# Patient Record
Sex: Female | Born: 1990 | Race: Asian | Hispanic: No | Marital: Single | Smoking: Never smoker
Health system: Southern US, Community
[De-identification: ages and names within clinical notes are randomized; demographics above are authoritative.]

## PROBLEM LIST (undated history)

## (undated) HISTORY — PX: OVARIAN CYST REMOVAL: SHX89

---

## 2014-04-03 ENCOUNTER — Encounter (HOSPITAL_COMMUNITY): Payer: 59 | Admitting: Anesthesiology

## 2014-04-03 ENCOUNTER — Emergency Department (HOSPITAL_COMMUNITY): Payer: 59

## 2014-04-03 ENCOUNTER — Encounter (HOSPITAL_COMMUNITY): Payer: Self-pay | Admitting: Emergency Medicine

## 2014-04-03 ENCOUNTER — Observation Stay (HOSPITAL_COMMUNITY): Payer: 59 | Admitting: Anesthesiology

## 2014-04-03 ENCOUNTER — Observation Stay (HOSPITAL_COMMUNITY)
Admission: EM | Admit: 2014-04-03 | Discharge: 2014-04-04 | Disposition: A | Discharge status: Home / Self Care | Payer: 59 | Attending: Surgery | Admitting: Surgery

## 2014-04-03 ENCOUNTER — Encounter (HOSPITAL_COMMUNITY)
Admission: EM | Disposition: A | Discharge status: Home / Self Care | Payer: Self-pay | Source: Home / Self Care | Attending: Emergency Medicine

## 2014-04-03 DIAGNOSIS — R1012 Left upper quadrant pain: Secondary | ICD-10-CM

## 2014-04-03 DIAGNOSIS — K37 Unspecified appendicitis: Secondary | ICD-10-CM

## 2014-04-03 DIAGNOSIS — K3580 Unspecified acute appendicitis: Principal | ICD-10-CM | POA: Insufficient documentation

## 2014-04-03 DIAGNOSIS — R112 Nausea with vomiting, unspecified: Secondary | ICD-10-CM

## 2014-04-03 HISTORY — PX: LAPAROSCOPIC APPENDECTOMY: SHX408

## 2014-04-03 HISTORY — PX: APPENDECTOMY: SHX54

## 2014-04-03 LAB — COMPREHENSIVE METABOLIC PANEL
ALT: 8 U/L (ref 0–35)
AST: 15 U/L (ref 0–37)
Albumin: 4.7 g/dL (ref 3.5–5.2)
Alkaline Phosphatase: 32 U/L — ABNORMAL LOW (ref 39–117)
BUN: 14 mg/dL (ref 6–23)
CO2: 21 mEq/L (ref 19–32)
Calcium: 9.4 mg/dL (ref 8.4–10.5)
Chloride: 102 mEq/L (ref 96–112)
Creatinine, Ser: 0.7 mg/dL (ref 0.50–1.10)
GFR calc Af Amer: >90 mL/min (ref >90)
GFR calc non Af Amer: >90 mL/min (ref >90)
Glucose, Bld: 139 mg/dL — ABNORMAL HIGH (ref 70–99)
Potassium: 3.5 mEq/L — ABNORMAL LOW (ref 3.7–5.3)
Sodium: 138 mEq/L (ref 137–147)
TOTAL PROTEIN: 7.8 g/dL (ref 6.0–8.3)
Total Bilirubin: 0.7 mg/dL (ref 0.3–1.2)

## 2014-04-03 LAB — CBC WITH DIFFERENTIAL/PLATELET
BASOS ABS: 0 10*3/uL (ref 0.0–0.1)
Basophils Relative: 0 % (ref 0–1)
EOS ABS: 0 10*3/uL (ref 0.0–0.7)
Eosinophils Relative: 0 % (ref 0–5)
HCT: 36.9 % (ref 36.0–46.0)
HEMOGLOBIN: 12.7 g/dL (ref 12.0–15.0)
Lymphocytes Relative: 18 % (ref 12–46)
Lymphs Abs: 2.6 10*3/uL (ref 0.7–4.0)
MCH: 31.1 pg (ref 26.0–34.0)
MCHC: 34.4 g/dL (ref 30.0–36.0)
MCV: 90.4 fL (ref 78.0–100.0)
MONOS PCT: 7 % (ref 3–12)
Monocytes Absolute: 1 10*3/uL (ref 0.1–1.0)
NEUTROS ABS: 10.6 10*3/uL — AB (ref 1.7–7.7)
Neutrophils Relative %: 75 % (ref 43–77)
PLATELETS: 190 10*3/uL (ref 150–400)
RBC: 4.08 MIL/uL (ref 3.87–5.11)
RDW: 11.7 % (ref 11.5–15.5)
WBC: 14.3 10*3/uL — ABNORMAL HIGH (ref 4.0–10.5)

## 2014-04-03 LAB — URINALYSIS, ROUTINE W REFLEX MICROSCOPIC
Bilirubin Urine: NEGATIVE
GLUCOSE, UA: NEGATIVE mg/dL
Hgb urine dipstick: NEGATIVE
Ketones, ur: 40 mg/dL — AB
NITRITE: NEGATIVE
PH: 5.5 (ref 5.0–8.0)
Protein, ur: NEGATIVE mg/dL
SPECIFIC GRAVITY, URINE: 1.023 (ref 1.005–1.030)
Urobilinogen, UA: 0.2 mg/dL (ref 0.0–1.0)

## 2014-04-03 LAB — LIPASE, BLOOD: LIPASE: 42 U/L (ref 11–59)

## 2014-04-03 LAB — POC URINE PREG, ED: Preg Test, Ur: NEGATIVE

## 2014-04-03 LAB — URINE MICROSCOPIC-ADD ON

## 2014-04-03 SURGERY — APPENDECTOMY, LAPAROSCOPIC
Anesthesia: General | Site: Abdomen

## 2014-04-03 MED ORDER — PROPOFOL 10 MG/ML IV BOLUS
INTRAVENOUS | Status: DC | PRN
  Administered 2014-04-03: 150 mg via INTRAVENOUS

## 2014-04-03 MED ORDER — MORPHINE SULFATE 2 MG/ML IJ SOLN: 2.0000 mg | INTRAMUSCULAR | Status: DC | PRN

## 2014-04-03 MED ORDER — LACTATED RINGERS IV SOLN
INTRAVENOUS | Status: DC | PRN
  Administered 2014-04-03: 09:00:00 via INTRAVENOUS

## 2014-04-03 MED ORDER — PIPERACILLIN-TAZOBACTAM 3.375 G IVPB
3.3750 g | Freq: Three times a day (TID) | INTRAVENOUS | Status: DC
  Filled 2014-04-03 (×2): qty 50

## 2014-04-03 MED ORDER — CEFAZOLIN SODIUM-DEXTROSE 2-3 GM-% IV SOLR
2.0000 g | Freq: Once | INTRAVENOUS | Status: AC
  Administered 2014-04-03: 2 g via INTRAVENOUS

## 2014-04-03 MED ORDER — CEFAZOLIN SODIUM-DEXTROSE 2-3 GM-% IV SOLR
INTRAVENOUS | Status: AC
  Filled 2014-04-03: qty 50

## 2014-04-03 MED ORDER — ONDANSETRON HCL 4 MG/2ML IJ SOLN
4.0000 mg | Freq: Once | INTRAMUSCULAR | Status: AC
  Administered 2014-04-03: 4 mg via INTRAVENOUS
  Filled 2014-04-03: qty 2

## 2014-04-03 MED ORDER — SODIUM CHLORIDE 0.9 % IV BOLUS (SEPSIS)
1000.0000 mL | Freq: Once | INTRAVENOUS | Status: AC
  Administered 2014-04-03: 1000 mL via INTRAVENOUS

## 2014-04-03 MED ORDER — LIDOCAINE HCL (CARDIAC) 20 MG/ML IV SOLN
INTRAVENOUS | Status: DC | PRN
  Administered 2014-04-03: 60 mg via INTRAVENOUS

## 2014-04-03 MED ORDER — IOHEXOL 300 MG/ML  SOLN
20.0000 mL | INTRAMUSCULAR | Status: AC
  Administered 2014-04-03: 25 mL via ORAL

## 2014-04-03 MED ORDER — OXYCODONE HCL 5 MG/5ML PO SOLN: 5.0000 mg | Freq: Once | ORAL | Status: DC | PRN

## 2014-04-03 MED ORDER — MIDAZOLAM HCL 2 MG/2ML IJ SOLN
INTRAMUSCULAR | Status: AC
  Filled 2014-04-03: qty 2

## 2014-04-03 MED ORDER — OXYCODONE-ACETAMINOPHEN 5-325 MG PO TABS: 1.0000 | ORAL_TABLET | ORAL | Status: DC | PRN

## 2014-04-03 MED ORDER — ONDANSETRON HCL 4 MG/2ML IJ SOLN: 4.0000 mg | Freq: Once | INTRAMUSCULAR | Status: DC | PRN

## 2014-04-03 MED ORDER — FENTANYL CITRATE 0.05 MG/ML IJ SOLN
50.0000 ug | Freq: Once | INTRAMUSCULAR | Status: AC
  Administered 2014-04-03: 50 ug via INTRAVENOUS
  Filled 2014-04-03: qty 2

## 2014-04-03 MED ORDER — OXYCODONE HCL 5 MG PO TABS: 5.0000 mg | ORAL_TABLET | Freq: Once | ORAL | Status: DC | PRN

## 2014-04-03 MED ORDER — HYDROMORPHONE HCL PF 1 MG/ML IJ SOLN: 0.2500 mg | INTRAMUSCULAR | Status: DC | PRN

## 2014-04-03 MED ORDER — ONDANSETRON HCL 4 MG/2ML IJ SOLN
INTRAMUSCULAR | Status: DC | PRN
  Administered 2014-04-03: 4 mg via INTRAVENOUS

## 2014-04-03 MED ORDER — MORPHINE SULFATE 2 MG/ML IJ SOLN: 1.0000 mg | INTRAMUSCULAR | Status: DC | PRN

## 2014-04-03 MED ORDER — BUPIVACAINE-EPINEPHRINE 0.25% -1:200000 IJ SOLN
INTRAMUSCULAR | Status: DC | PRN
  Administered 2014-04-03: 20 mL

## 2014-04-03 MED ORDER — FENTANYL CITRATE 0.05 MG/ML IJ SOLN
INTRAMUSCULAR | Status: DC | PRN
  Administered 2014-04-03: 100 ug via INTRAVENOUS

## 2014-04-03 MED ORDER — ACETAMINOPHEN 325 MG PO TABS: 650.0000 mg | ORAL_TABLET | Freq: Four times a day (QID) | ORAL | Status: DC | PRN

## 2014-04-03 MED ORDER — KETOROLAC TROMETHAMINE 30 MG/ML IJ SOLN
30.0000 mg | Freq: Once | INTRAMUSCULAR | Status: AC
  Administered 2014-04-03: 30 mg via INTRAVENOUS

## 2014-04-03 MED ORDER — DEXAMETHASONE SODIUM PHOSPHATE 10 MG/ML IJ SOLN
INTRAMUSCULAR | Status: DC | PRN
  Administered 2014-04-03: 4 mg via INTRAVENOUS

## 2014-04-03 MED ORDER — KETOROLAC TROMETHAMINE 30 MG/ML IJ SOLN
INTRAMUSCULAR | Status: AC
  Filled 2014-04-03: qty 1

## 2014-04-03 MED ORDER — 0.9 % SODIUM CHLORIDE (POUR BTL) OPTIME
TOPICAL | Status: DC | PRN
  Administered 2014-04-03: 1000 mL

## 2014-04-03 MED ORDER — SUCCINYLCHOLINE CHLORIDE 20 MG/ML IJ SOLN
INTRAMUSCULAR | Status: DC | PRN
  Administered 2014-04-03: 40 mg via INTRAVENOUS

## 2014-04-03 MED ORDER — PROMETHAZINE HCL 25 MG/ML IJ SOLN
12.5000 mg | Freq: Once | INTRAMUSCULAR | Status: AC
  Administered 2014-04-03: 12.5 mg via INTRAVENOUS
  Filled 2014-04-03: qty 1

## 2014-04-03 MED ORDER — ONDANSETRON HCL 4 MG/2ML IJ SOLN: 4.0000 mg | Freq: Four times a day (QID) | INTRAMUSCULAR | Status: DC | PRN

## 2014-04-03 MED ORDER — MORPHINE SULFATE 4 MG/ML IJ SOLN
4.0000 mg | Freq: Once | INTRAMUSCULAR | Status: AC
  Administered 2014-04-03: 4 mg via INTRAVENOUS
  Filled 2014-04-03: qty 1

## 2014-04-03 MED ORDER — IOHEXOL 300 MG/ML  SOLN
100.0000 mL | Freq: Once | INTRAMUSCULAR | Status: AC | PRN
  Administered 2014-04-03: 100 mL via INTRAVENOUS

## 2014-04-03 MED ORDER — ACETAMINOPHEN 650 MG RE SUPP: 650.0000 mg | Freq: Four times a day (QID) | RECTAL | Status: DC | PRN

## 2014-04-03 MED ORDER — MIDAZOLAM HCL 5 MG/5ML IJ SOLN
INTRAMUSCULAR | Status: DC | PRN
  Administered 2014-04-03: 2 mg via INTRAVENOUS

## 2014-04-03 MED ORDER — FENTANYL CITRATE 0.05 MG/ML IJ SOLN
INTRAMUSCULAR | Status: AC
  Filled 2014-04-03: qty 5

## 2014-04-03 MED ORDER — POTASSIUM CHLORIDE IN NACL 20-0.9 MEQ/L-% IV SOLN
INTRAVENOUS | Status: DC
  Administered 2014-04-03 – 2014-04-04 (×3): via INTRAVENOUS
  Filled 2014-04-03 (×5): qty 1000

## 2014-04-03 MED ORDER — DEXAMETHASONE SODIUM PHOSPHATE 4 MG/ML IJ SOLN
INTRAMUSCULAR | Status: AC
  Filled 2014-04-03: qty 1

## 2014-04-03 MED ORDER — SODIUM CHLORIDE 0.9 % IR SOLN
Status: DC | PRN
  Administered 2014-04-03: 1000 mL

## 2014-04-03 SURGICAL SUPPLY — 46 items
APPLIER CLIP 5 13 M/L LIGAMAX5 (MISCELLANEOUS)
APPLIER CLIP ROT 10 11.4 M/L (STAPLE)
BANDAGE ADHESIVE 1X3 (GAUZE/BANDAGES/DRESSINGS) ×9 IMPLANT
BENZOIN TINCTURE PRP APPL 2/3 (GAUZE/BANDAGES/DRESSINGS) ×3 IMPLANT
CANISTER SUCTION 2500CC (MISCELLANEOUS) ×3 IMPLANT
CHLORAPREP W/TINT 26ML (MISCELLANEOUS) ×3 IMPLANT
CLIP APPLIE 5 13 M/L LIGAMAX5 (MISCELLANEOUS) IMPLANT
CLIP APPLIE ROT 10 11.4 M/L (STAPLE) IMPLANT
COVER SURGICAL LIGHT HANDLE (MISCELLANEOUS) ×3 IMPLANT
CUTTER LINEAR ENDO 35 ETS (STAPLE) ×3 IMPLANT
CUTTER LINEAR ENDO 35 ETS TH (STAPLE) IMPLANT
DECANTER SPIKE VIAL GLASS SM (MISCELLANEOUS) ×3 IMPLANT
DRAPE UTILITY 15X26 W/TAPE STR (DRAPE) ×6 IMPLANT
ELECT REM PT RETURN 9FT ADLT (ELECTROSURGICAL) ×3
ELECTRODE REM PT RTRN 9FT ADLT (ELECTROSURGICAL) ×1 IMPLANT
ENDOLOOP SUT PDS II  0 18 (SUTURE)
ENDOLOOP SUT PDS II 0 18 (SUTURE) IMPLANT
GLOVE BIOGEL PI IND STRL 6.5 (GLOVE) ×2 IMPLANT
GLOVE BIOGEL PI IND STRL 7.0 (GLOVE) ×1 IMPLANT
GLOVE BIOGEL PI INDICATOR 6.5 (GLOVE) ×4
GLOVE BIOGEL PI INDICATOR 7.0 (GLOVE) ×2
GLOVE SURG SIGNA 7.5 PF LTX (GLOVE) ×3 IMPLANT
GLOVE SURG SS PI 6.5 STRL IVOR (GLOVE) ×3 IMPLANT
GLOVE SURG SS PI 7.0 STRL IVOR (GLOVE) ×3 IMPLANT
GOWN STRL REUS W/ TWL LRG LVL3 (GOWN DISPOSABLE) ×1 IMPLANT
GOWN STRL REUS W/ TWL XL LVL3 (GOWN DISPOSABLE) ×1 IMPLANT
GOWN STRL REUS W/TWL LRG LVL3 (GOWN DISPOSABLE) ×2
GOWN STRL REUS W/TWL XL LVL3 (GOWN DISPOSABLE) ×2
KIT BASIN OR (CUSTOM PROCEDURE TRAY) ×3 IMPLANT
KIT ROOM TURNOVER OR (KITS) ×3 IMPLANT
NS IRRIG 1000ML POUR BTL (IV SOLUTION) ×3 IMPLANT
PAD ARMBOARD 7.5X6 YLW CONV (MISCELLANEOUS) ×6 IMPLANT
POUCH SPECIMEN RETRIEVAL 10MM (ENDOMECHANICALS) ×3 IMPLANT
RELOAD /EVU35 (ENDOMECHANICALS) IMPLANT
RELOAD CUTTER ETS 35MM STAND (ENDOMECHANICALS) IMPLANT
SCALPEL HARMONIC ACE (MISCELLANEOUS) ×3 IMPLANT
SET IRRIG TUBING LAPAROSCOPIC (IRRIGATION / IRRIGATOR) ×3 IMPLANT
SLEEVE ENDOPATH XCEL 5M (ENDOMECHANICALS) ×3 IMPLANT
SPECIMEN JAR SMALL (MISCELLANEOUS) ×3 IMPLANT
SUT MON AB 4-0 PC3 18 (SUTURE) ×3 IMPLANT
TOWEL OR 17X24 6PK STRL BLUE (TOWEL DISPOSABLE) ×3 IMPLANT
TOWEL OR 17X26 10 PK STRL BLUE (TOWEL DISPOSABLE) ×3 IMPLANT
TRAY LAPAROSCOPIC (CUSTOM PROCEDURE TRAY) ×3 IMPLANT
TROCAR XCEL BLUNT TIP 100MML (ENDOMECHANICALS) ×3 IMPLANT
TROCAR XCEL NON-BLD 5MMX100MML (ENDOMECHANICALS) ×3 IMPLANT
WATER STERILE IRR 1000ML POUR (IV SOLUTION) IMPLANT

## 2014-04-03 NOTE — Anesthesia Procedure Notes (Addendum)
Procedure Name: Intubation Date/Time: 04/03/2014 9:36 AM Performed by: Quentin Ore Pre-anesthesia Checklist: Patient identified, Emergency Drugs available, Suction available, Patient being monitored and Timeout performed Patient Re-evaluated:Patient Re-evaluated prior to inductionOxygen Delivery Method: Circle system utilized Preoxygenation: Pre-oxygenation with 100% oxygen Intubation Type: IV induction Ventilation: Mask ventilation without difficulty Laryngoscope Size: Mac and 3 Grade View: Grade I Tube type: Oral Tube size: 7.0 mm Number of attempts: 1 Airway Equipment and Method: Stylet Placement Confirmation: ETT inserted through vocal cords under direct vision,  positive ETCO2 and breath sounds checked- equal and bilateral Secured at: 21 cm Tube secured with: Tape Dental Injury: Teeth and Oropharynx as per pre-operative assessment

## 2014-04-03 NOTE — Op Note (Signed)
Appendectomy, Lap, Procedure Note  Indications: The patient presented with a history of right-sided abdominal pain. A CT revealed findings consistent with acute appendicitis.  Pre-operative Diagnosis: Acute appendicitis without mention of peritonitis  Post-operative Diagnosis: Same  Surgeon: Shelly Rubenstein   Assistants: 0  Anesthesia: General endotracheal anesthesia  ASA Class: 1  Procedure Details  The patient was seen again in the Holding Room. The risks, benefits, complications, treatment options, and expected outcomes were discussed with the patient and/or family. The possibilities of reaction to medication, perforation of viscus, bleeding, recurrent infection, finding a normal appendix, the need for additional procedures, failure to diagnose a condition, and creating a complication requiring transfusion or operation were discussed. There was concurrence with the proposed plan and informed consent was obtained. The site of surgery was properly noted. The patient was taken to Operating Room, identified as Gillie Manners and the procedure verified as Appendectomy. A Time Out was held and the above information confirmed.  The patient was placed in the supine position and general anesthesia was induced, along with placement of orogastric tube, Venodyne boots, and a Foley catheter. The abdomen was prepped and draped in a sterile fashion. A one centimeter infraumbilical incision was made.  The umbilical stalk was elevated, and the midline fascia was incised with a #11 blade.  A Kelly clamp was used to confirm entrance into the peritoneal cavity.  A pursestring suture was passed around the incision with a 0 Vicryl.  The Hasson was introduced into the abdomen and the tails of the suture were used to hold the Hasson in place.   The pneumoperitoneum was then established to steady pressure of 15 mmHg.  Additional 5 mm cannulas then placed in the left lower quadrant of the abdomen and the suprapubic region  under direct visualization. A careful evaluation of the entire abdomen was carried out. The patient was placed in Trendelenburg and left lateral decubitus position. The small intestines were retracted in the cephalad and left lateral direction away from the pelvis and right lower quadrant. The patient was found to have an enlarged and inflamed appendix that was extending into the pelvis. There was no evidence of perforation.  The appendix was carefully dissected. The appendix was was skeletonized with the harmonic scalpel.   The appendix was divided at its base using an endo-GIA stapler. Minimal appendiceal stump was left in place. There was no evidence of bleeding, leakage, or complication after division of the appendix. Irrigation was also performed and irrigate suctioned from the abdomen as well.  The umbilical port site was closed with the purse string suture. There was no residual palpable fascial defect.  The trocar site skin wounds were closed with 4-0 Monocryl.  Instrument, sponge, and needle counts were correct at the conclusion of the case.   Findings: The appendix was found to be inflamed. There were not signs of necrosis.  There was not perforation. There was not abscess formation.  Estimated Blood Loss:  Minimal         Drains:none         Complications:  None; patient tolerated the procedure well.         Disposition: PACU - hemodynamically stable.         Condition: stable

## 2014-04-03 NOTE — Transfer of Care (Signed)
Immediate Anesthesia Transfer of Care Note  Patient: Kathy Roberts  Procedure(s) Performed: Procedure(s): APPENDECTOMY LAPAROSCOPIC (N/A)  Patient Location: PACU  Anesthesia Type:General  Level of Consciousness: awake, alert  and oriented  Airway & Oxygen Therapy: Patient Spontanous Breathing and Patient connected to nasal cannula oxygen  Post-op Assessment: Report given to PACU RN, Post -op Vital signs reviewed and stable and Patient moving all extremities X 4  Post vital signs: Reviewed and stable  Complications: No apparent anesthesia complications

## 2014-04-03 NOTE — H&P (Signed)
Kathy Roberts is an 23 y.o. female.   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  Started having some abdominal pain after lunch yesterday.  Mid epigastric, she didn't think it was to bad.  She took some Mylanta, without improvement.  She had supper and then developed worsening abdominal pain, nausea and vomiting.  She came to the ED last PM where work up shows elevated WBC 14.3. CT scan shows 14 mm appendix with fluid and feculent material consistent with appendicitis.  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  We are ask to see.  History reviewed. No pertinent past medical history. None  Past Surgical History  Procedure Laterality Date  . Ovarian cyst removal      No family history on file. Social History:  reports that she has never smoked. She does not have any smokeless tobacco history on file. She reports that she does not drink alcohol. Her drug history is not on file.  Allergies: No Known Allergies   Prior to Admission medications   Not on File     Results for orders placed during the hospital encounter of 04/03/14 (from the past 48 hour(s))  CBC WITH DIFFERENTIAL     Status: Abnormal   Collection Time    04/03/14  1:08 AM      Result Value Ref Range   WBC 14.3 (*) 4.0 - 10.5 K/uL   RBC 4.08  3.87 - 5.11 MIL/uL   Hemoglobin 12.7  12.0 - 15.0 g/dL   HCT 36.9  36.0 - 46.0 %   MCV 90.4  78.0 - 100.0 fL   MCH 31.1  26.0 - 34.0 pg   MCHC 34.4  30.0 - 36.0 g/dL   RDW 11.7  11.5 - 15.5 %   Platelets 190  150 - 400 K/uL   Neutrophils Relative % 75  43 - 77 %   Neutro Abs 10.6 (*) 1.7 - 7.7 K/uL   Lymphocytes Relative 18  12 - 46 %   Lymphs Abs 2.6  0.7 - 4.0 K/uL   Monocytes Relative 7  3 - 12 %   Monocytes Absolute 1.0  0.1 - 1.0 K/uL   Eosinophils Relative 0  0 - 5 %   Eosinophils Absolute 0.0  0.0 - 0.7 K/uL   Basophils Relative 0  0 - 1 %   Basophils Absolute 0.0  0.0 - 0.1 K/uL   COMPREHENSIVE METABOLIC PANEL     Status: Abnormal   Collection Time    04/03/14  1:08 AM      Result Value Ref Range   Sodium 138  137 - 147 mEq/L   Potassium 3.5 (*) 3.7 - 5.3 mEq/L   Chloride 102  96 - 112 mEq/L   CO2 21  19 - 32 mEq/L   Glucose, Bld 139 (*) 70 - 99 mg/dL   BUN 14  6 - 23 mg/dL   Creatinine, Ser 0.70  0.50 - 1.10 mg/dL   Calcium 9.4  8.4 - 10.5 mg/dL   Total Protein 7.8  6.0 - 8.3 g/dL   Albumin 4.7  3.5 - 5.2 g/dL   AST 15  0 - 37 U/L   ALT 8  0 - 35 U/L   Alkaline Phosphatase 32 (*) 39 - 117 U/L   Total Bilirubin 0.7  0.3 - 1.2 mg/dL   GFR calc non Af Amer >90  >90 mL/min   GFR calc  Af Amer >90  >90 mL/min   Comment: (NOTE)     The eGFR has been calculated using the CKD EPI equation.     This calculation has not been validated in all clinical situations.     eGFR's persistently <90 mL/min signify possible Chronic Kidney     Disease.  LIPASE, BLOOD     Status: None   Collection Time    04/03/14  1:08 AM      Result Value Ref Range   Lipase 42  11 - 59 U/L  URINALYSIS, ROUTINE W REFLEX MICROSCOPIC     Status: Abnormal   Collection Time    04/03/14  2:31 AM      Result Value Ref Range   Color, Urine YELLOW  YELLOW   APPearance CLOUDY (*) CLEAR   Specific Gravity, Urine 1.023  1.005 - 1.030   pH 5.5  5.0 - 8.0   Glucose, UA NEGATIVE  NEGATIVE mg/dL   Hgb urine dipstick NEGATIVE  NEGATIVE   Bilirubin Urine NEGATIVE  NEGATIVE   Ketones, ur 40 (*) NEGATIVE mg/dL   Protein, ur NEGATIVE  NEGATIVE mg/dL   Urobilinogen, UA 0.2  0.0 - 1.0 mg/dL   Nitrite NEGATIVE  NEGATIVE   Leukocytes, UA SMALL (*) NEGATIVE  URINE MICROSCOPIC-ADD ON     Status: None   Collection Time    04/03/14  2:31 AM      Result Value Ref Range   Squamous Epithelial / LPF RARE  RARE   WBC, UA 3-6  <3 WBC/hpf   Bacteria, UA RARE  RARE  POC URINE PREG, ED     Status: None   Collection Time    04/03/14  2:37 AM      Result Value Ref Range   Preg Test, Ur NEGATIVE  NEGATIVE    Comment:            THE SENSITIVITY OF THIS     METHODOLOGY IS >24 mIU/mL   Ct Abdomen Pelvis W Contrast  04/03/2014   CLINICAL DATA:  Epigastric and right lower quadrant pain.  EXAM: CT ABDOMEN AND PELVIS WITH CONTRAST  TECHNIQUE: Multidetector CT imaging of the abdomen and pelvis was performed using the standard protocol following bolus administration of intravenous contrast.  CONTRAST:  141m OMNIPAQUE IOHEXOL 300 MG/ML  SOLN  COMPARISON:  None.  FINDINGS: BODY WALL: Unremarkable.  LOWER CHEST: Unremarkable.  ABDOMEN/PELVIS:  Liver: No focal abnormality.  Biliary: No evidence of biliary obstruction or stone.  Pancreas: Unremarkable.  Spleen: Unremarkable.  Adrenals: Unremarkable.  Kidneys and ureters: No hydronephrosis or stone.  Bladder: Unremarkable.  Reproductive: Corpus luteum noted on the right  Bowel: The appendix is dilated to 14 mm outer wall diameter and contains fluid and feculent material. The wall is avidly enhancing and there is early periappendiceal fat haziness. No bowel obstruction. No evidence of perforation or abscess.  Retroperitoneum: There is a lobulated water density mass in the left upper quadrant, in the region of the splenorenal ligament, measuring up to 4 cm. This is most consistent with a lymphatic malformation. No solid components identified.  Peritoneum: No free fluid or gas.  Vascular: No acute abnormality.  OSSEOUS: No acute abnormalities.  These results were called by telephone at the time of interpretation on 04/03/2014 at 6:31 AM to Dr. OLinton Flemings, who verbally acknowledged these results.  IMPRESSION: 1. Acute, non perforated appendicitis. 2. 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   Electronically Signed  By: Jorje Guild M.D.   On: 04/03/2014 06:35    Review of Systems  Constitutional: Negative for fever, chills, weight loss, malaise/fatigue and diaphoresis.  HENT: Negative.   Eyes: Negative.   Respiratory: Negative.    Cardiovascular: Negative.   Gastrointestinal: Positive for nausea, vomiting and abdominal pain. Negative for heartburn, diarrhea, constipation, blood in stool and melena.  Genitourinary: Negative.   Musculoskeletal: Negative.   Skin: Negative.   Neurological: Negative.  Negative for weakness.  Endo/Heme/Allergies: Negative.   Psychiatric/Behavioral: Negative.     Blood pressure 123/78, pulse 120, temperature 98.3 F (36.8 C), temperature source Oral, resp. rate 18, height 5' 6.93" (1.7 m), weight 53 kg (116 lb 13.5 oz), last menstrual period 03/07/2014, SpO2 100.00%. Physical Exam  Constitutional: She is oriented to person, place, and time. She appears well-developed and well-nourished. No distress.  Thin [place] woman no acute distress, pain now in RLQ  HENT:  Head: Normocephalic and atraumatic.  Nose: Nose normal.  Eyes: Conjunctivae and EOM are normal. Pupils are equal, round, and reactive to light. Right eye exhibits no discharge. Left eye exhibits no discharge. No scleral icterus.  Neck: Normal range of motion. Neck supple. No JVD present. No tracheal deviation present. No thyromegaly present.  Cardiovascular: Normal rate, regular rhythm, normal heart sounds and intact distal pulses.  Exam reveals no gallop.   No murmur heard. Respiratory: Effort normal and breath sounds normal. No respiratory distress. She has no wheezes. She has no rales. She exhibits no tenderness.  GI: Soft. Bowel sounds are normal. She exhibits no distension. There is tenderness (Pain RLQ).  She has 3 small scars from lap surgery for ovarian cyst in [place].  Musculoskeletal: She exhibits no edema and no tenderness.  Lymphadenopathy:    She has no cervical adenopathy.  Neurological: She is alert and oriented to person, place, and time. No cranial nerve deficit.  Skin: Skin is warm and dry. No rash noted. She is not diaphoretic. No erythema. No pallor.  Psychiatric: She has a normal mood and affect. Her  behavior is normal. Judgment and thought content normal.     Assessment/Plan 1.  Acute appendicitis 2. Hx of Ovarian cyst removal, she had allot of pain with this Post op. 3.  LUQ lymphatic malformation  Plan: to [facility] this AM for appendectomy I have discussed the procedure and risks of appendectomy. The risks include but are not limited to bleeding, infection, wound problems, anesthesia, injury to intra-abdominal organs, possibility of postoperative ileus. He/she seems to understand and agrees with the plan.   Earnstine Regal 04/03/2014, 7:57 AM

## 2014-04-03 NOTE — H&P (Signed)
I have seen and examined the patient and agree with the assessment and plans. Plan lap appendectomy today.  I discussed the risks with the patient in detail and she agrees to proceed.  Arilla Hice A. Magnus Ivan  MD, FACS

## 2014-04-03 NOTE — Discharge Instructions (Signed)
Laparoscopic Appendectomy °Care After °Refer to this sheet in the next few weeks. These instructions provide you with information on caring for yourself after your procedure. Your caregiver may also give you more specific instructions. Your treatment has been planned according to current medical practices, but problems sometimes occur. Call your caregiver if you have any problems or questions after your procedure. °HOME CARE INSTRUCTIONS °· Do not drive while taking narcotic pain medicines. °· Use stool softener if you become constipated from your pain medicines. °· Change your bandages (dressings) as directed. °· Keep your wounds clean and dry. You may wash the wounds gently with soap and water. Gently pat the wounds dry with a clean towel. °· Do not take baths, swim, or use hot tubs for 10 days, or as instructed by your caregiver. °· Only take over-the-counter or prescription medicines for pain, discomfort, or fever as directed by your caregiver. °· You may continue your normal diet as directed. °· Do not lift more than 10 pounds (4.5 kg) or play contact sports for 3 weeks, or as directed. °· Slowly increase your activity after surgery. °· Take deep breaths to avoid getting a lung infection (pneumonia). °SEEK MEDICAL CARE IF: °· You have redness, swelling, or increasing pain in your wounds. °· You have pus coming from your wounds. °· You have drainage from a wound that lasts longer than 1 day. °· You notice a bad smell coming from the wounds or dressing. °· Your wound edges break open after stitches (sutures) have been removed. °· You notice increasing pain in the shoulders (shoulder strap areas) or near your shoulder blades. °· You develop dizzy episodes or fainting while standing. °· You develop shortness of breath. °· You develop persistent nausea or vomiting. °· You cannot control your bowel functions or lose your appetite. °· You develop diarrhea. °SEEK IMMEDIATE MEDICAL CARE IF:  °· You have a fever. °· You  develop a rash. °· You have difficulty breathing or sharp pains in your chest. °· You develop any reaction or side effects to medicines given. °MAKE SURE YOU: °· Understand these instructions. °· Will watch your condition. °· Will get help right away if you are not doing well or get worse. °Document Released: 10/20/2005 Document Revised: 01/12/2012 Document Reviewed: 04/29/2011 °ExitCare® Patient Information ©2014 [employer]. ° °CCS ______[facility] °LAPAROSCOPIC SURGERY: POST OP INSTRUCTIONS °Always review your discharge instruction sheet given to you by the facility where your surgery was performed. °IF YOU HAVE DISABILITY OR FAMILY LEAVE FORMS, YOU MUST BRING THEM TO THE OFFICE FOR PROCESSING.   °DO NOT GIVE THEM TO YOUR DOCTOR. ° °1. A prescription for pain medication may be given to you upon discharge.  Take your pain medication as prescribed, if needed.  If narcotic pain medicine is not needed, then you may take acetaminophen (Tylenol) or ibuprofen (Advil) as needed. °2. Take your usually prescribed medications unless otherwise directed. °3. If you need a refill on your pain medication, please contact your pharmacy.  They will contact our office to request authorization. Prescriptions will not be filled after 5pm or on week-ends. °4. You should follow a light diet the first few days after arrival home, such as soup and crackers, etc.  Be sure to include lots of fluids daily. °5. Most patients will experience some swelling and bruising in the area of the incisions.  Ice packs will help.  Swelling and bruising can take several days to resolve.  °6. It is common to experience   some constipation if taking pain medication after surgery.  Increasing fluid intake and taking a stool softener (such as Colace) will usually help or prevent this problem from occurring.  A mild laxative (Milk of Magnesia or Miralax) should be taken according to package instructions if there are no bowel movements  after 48 hours. °7. Unless discharge instructions indicate otherwise, you may remove your bandages 24-48 hours after surgery, and you may shower at that time.  You may have steri-strips (small skin tapes) in place directly over the incision.  These strips should be left on the skin for 7-10 days.  If your surgeon used skin glue on the incision, you may shower in 24 hours.  The glue will flake off over the next 2-3 weeks.  Any sutures or staples will be removed at the office during your follow-up visit. °8. ACTIVITIES:  You may resume regular (light) daily activities beginning the next day--such as daily self-care, walking, climbing stairs--gradually increasing activities as tolerated.  You may have sexual intercourse when it is comfortable.  Refrain from any heavy lifting or straining until approved by your doctor. °a. You may drive when you are no longer taking prescription pain medication, you can comfortably wear a seatbelt, and you can safely maneuver your car and apply brakes. °b. RETURN TO WORK:  __________________________________________________________ °9. You should see your doctor in the office for a follow-up appointment approximately 2-3 weeks after your surgery.  Make sure that you call for this appointment within a day or two after you arrive home to insure a convenient appointment time. °10. OTHER INSTRUCTIONS: __________________________________________________________________________________________________________________________ __________________________________________________________________________________________________________________________ °WHEN TO CALL YOUR DOCTOR: °1. Fever over 101.0 °2. Inability to urinate °3. Continued bleeding from incision. °4. Increased pain, redness, or drainage from the incision. °5. Increasing abdominal pain ° °The clinic staff is available to answer your questions during regular business hours.  Please don’t hesitate to call and ask to speak to one of the  nurses for clinical concerns.  If you have a medical emergency, go to the nearest emergency room or call 911.  A surgeon from [facility] is always on call at the hospital. °[address], [address], [place], [place]  [address] ? [address] [address], [place], [place]   [address] °[phone] ? [phone] ? FAX [phone] °Web site: [email or web address] ° °

## 2014-04-03 NOTE — ED Provider Notes (Signed)
CSN: [id number]     Arrival date & time 04/03/14  0048 History   First MD Initiated Contact with Patient 04/03/14 0108     Chief Complaint  Patient presents with  . Abdominal Pain     (Consider location/radiation/quality/duration/timing/severity/associated sxs/prior Treatment) HPI 23 year old female presents to the emergency apartment from home with complaint of epigastric pain associated with nausea and vomiting.  Patient felt somewhat nauseated this morning after breakfast with some pain.  After having dinner this evening, the pain increased and she had uncontrolled vomiting.  She denies any fever or chills.  No sick contacts.  Patient has limited [place].  No other complaints at this time. History reviewed. No pertinent past medical history. Past Surgical History  Procedure Laterality Date  . Ovarian cyst removal     No family history on file. History  Substance Use Topics  . Smoking status: Never Smoker   . Smokeless tobacco: Not on file  . Alcohol Use: No   OB History   Grav Para Term Preterm Abortions TAB SAB Ect Mult Living                 Review of Systems  Unable to perform ROS: Other   Language barrier   Allergies  Review of patient's allergies indicates no known allergies.  Home Medications   Prior to Admission medications   Not on File   BP 109/75  Pulse 87  Temp(Src) 98.3 F (36.8 C) (Oral)  Resp 18  Ht 5' 6.93" (1.7 m)  Wt 116 lb 13.5 oz (53 kg)  BMI 18.34 kg/m2  SpO2 100%  LMP 03/07/2014 Physical Exam  Nursing note and vitals reviewed. Constitutional: She is oriented to person, place, and time. She appears well-developed and well-nourished. She appears distressed.  HENT:  Head: Normocephalic and atraumatic.  Nose: Nose normal.  Mouth/Throat: Oropharynx is clear and moist.  Eyes: Conjunctivae and EOM are normal. Pupils are equal, round, and reactive to light.  Neck: Normal range of motion. Neck supple. No JVD present. No tracheal deviation  present. No thyromegaly present.  Cardiovascular: Normal rate, regular rhythm, normal heart sounds and intact distal pulses.  Exam reveals no gallop and no friction rub.   No murmur heard. Pulmonary/Chest: Effort normal and breath sounds normal. No stridor. No respiratory distress. She has no wheezes. She has no rales. She exhibits no tenderness.  Abdominal: Soft. Bowel sounds are normal. She exhibits no distension and no mass. There is tenderness (patient has significant tenderness in epigastrium and slightly in the right upper quadrant.  She also has some tenderness in the right lower quadrant without rebound or guarding.). There is no rebound and no guarding.  Musculoskeletal: Normal range of motion. She exhibits no edema and no tenderness.  Lymphadenopathy:    She has no cervical adenopathy.  Neurological: She is alert and oriented to person, place, and time. She exhibits normal muscle tone. Coordination normal.  Skin: Skin is warm and dry. No rash noted. No erythema. No pallor.  Psychiatric: She has a normal mood and affect. Her behavior is normal. Judgment and thought content normal.    ED Course  Procedures (including critical care time) Labs Review Labs Reviewed  CBC WITH DIFFERENTIAL - Abnormal; Notable for the following:    WBC 14.3 (*)    Neutro Abs 10.6 (*)    All other components within normal limits  COMPREHENSIVE METABOLIC PANEL - Abnormal; Notable for the following:    Potassium 3.5 (*)    Glucose,  Bld 139 (*)    Alkaline Phosphatase 32 (*)    All other components within normal limits  URINALYSIS, ROUTINE W REFLEX MICROSCOPIC - Abnormal; Notable for the following:    APPearance CLOUDY (*)    Ketones, ur 40 (*)    Leukocytes, UA SMALL (*)    All other components within normal limits  LIPASE, BLOOD  URINE MICROSCOPIC-ADD ON  POC URINE PREG, ED    Imaging Review Ct Abdomen Pelvis W Contrast  04/03/2014   CLINICAL DATA:  Epigastric and right lower quadrant pain.   EXAM: CT ABDOMEN AND PELVIS WITH CONTRAST  TECHNIQUE: Multidetector CT imaging of the abdomen and pelvis was performed using the standard protocol following bolus administration of intravenous contrast.  CONTRAST:  100mL OMNIPAQUE IOHEXOL 300 MG/ML  SOLN  COMPARISON:  None.  FINDINGS: BODY WALL: Unremarkable.  LOWER CHEST: Unremarkable.  ABDOMEN/PELVIS:  Liver: No focal abnormality.  Biliary: No evidence of biliary obstruction or stone.  Pancreas: Unremarkable.  Spleen: Unremarkable.  Adrenals: Unremarkable.  Kidneys and ureters: No hydronephrosis or stone.  Bladder: Unremarkable.  Reproductive: Corpus luteum noted on the right  Bowel: The appendix is dilated to 14 mm outer wall diameter and contains fluid and feculent material. The wall is avidly enhancing and there is early periappendiceal fat haziness. No bowel obstruction. No evidence of perforation or abscess.  Retroperitoneum: There is a lobulated water density mass in the left upper quadrant, in the region of the splenorenal ligament, measuring up to 4 cm. This is most consistent with a lymphatic malformation. No solid components identified.  Peritoneum: No free fluid or gas.  Vascular: No acute abnormality.  OSSEOUS: No acute abnormalities.  These results were called by telephone at the time of interpretation on 04/03/2014 at 6:31 AM to Dr. Marisa SeverinLGA Miki Labuda , who verbally acknowledged these results.  IMPRESSION: 1. Acute, non perforated appendicitis. 2. 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   Electronically Signed   By: Tiburcio PeaJonathan  Watts M.D.   On: 04/03/2014 06:35     EKG Interpretation None      MDM   Final diagnoses:  Appendicitis    23 year old female with one-day history of epigastric pain with nausea and vomiting.  Plan for labs, pain and nausea medicine and fluids via IV.  May need CT scan.   Olivia Mackielga M Robson Trickey, MD 04/03/14 [phone]

## 2014-04-03 NOTE — ED Notes (Signed)
Patient transported to CT 

## 2014-04-03 NOTE — ED Notes (Signed)
Patient transported from CT 

## 2014-04-03 NOTE — ED Notes (Addendum)
Pt to ED for evaluation of abdominal pain after eating lunch this afternoon- pt reports pain has gotten worse throughout the day and she recently began vomiting- pt actively vomiting upon arrival to exam room.  Pt denies diarrhea- denies vaginal discharge or bleeding, denies pain or difficulty with urination.

## 2014-04-03 NOTE — Anesthesia Preprocedure Evaluation (Addendum)
Anesthesia Evaluation  Patient identified by MRN, date of birth, ID band Patient awake    Reviewed: Allergy & Precautions, H&P , NPO status , Patient's Chart, lab work & pertinent test results  Airway Mallampati: I TM Distance: >3 FB Neck ROM: Full    Dental  (+) Teeth Intact, Dental Advisory Given   Pulmonary  breath sounds clear to auscultation        Cardiovascular Rhythm:Regular Rate:Normal     Neuro/Psych    GI/Hepatic   Endo/Other    Renal/GU      Musculoskeletal   Abdominal   Peds  Hematology   Anesthesia Other Findings   Reproductive/Obstetrics                         Anesthesia Physical Anesthesia Plan  ASA: I and emergent  Anesthesia Plan: General   Post-op Pain Management:    Induction: Intravenous  Airway Management Planned: Oral ETT  Additional Equipment:   Intra-op Plan:   Post-operative Plan: Extubation in OR  Informed Consent: I have reviewed the patients History and Physical, chart, labs and discussed the procedure including the risks, benefits and alternatives for the proposed anesthesia with the patient or authorized representative who has indicated his/her understanding and acceptance.   Dental advisory given  Plan Discussed with: CRNA and Anesthesiologist  Anesthesia Plan Comments:        Anesthesia Quick Evaluation

## 2014-04-03 NOTE — Anesthesia Postprocedure Evaluation (Signed)
  Anesthesia Post-op Note  Patient: Kathy Roberts  Procedure(s) Performed: Procedure(s): APPENDECTOMY LAPAROSCOPIC (N/A)  Patient Location: PACU  Anesthesia Type:General  Level of Consciousness: awake and alert   Airway and Oxygen Therapy: Patient Spontanous Breathing  Post-op Pain: mild  Post-op Assessment: Post-op Vital signs reviewed, Patient's Cardiovascular Status Stable, Respiratory Function Stable, Patent Airway, No signs of Nausea or vomiting and Pain level controlled  Post-op Vital Signs: Reviewed and stable  Last Vitals:  Filed Vitals:   04/03/14 1045  BP:   Pulse: 78  Temp:   Resp: 17    Complications: No apparent anesthesia complications

## 2014-04-04 ENCOUNTER — Encounter (HOSPITAL_COMMUNITY): Payer: Self-pay | Admitting: Surgery

## 2014-04-04 LAB — CBC
HCT: 30.7 % — ABNORMAL LOW (ref 36.0–46.0)
Hemoglobin: 10.1 g/dL — ABNORMAL LOW (ref 12.0–15.0)
MCH: 30.7 pg (ref 26.0–34.0)
MCHC: 32.9 g/dL (ref 30.0–36.0)
MCV: 93.3 fL (ref 78.0–100.0)
PLATELETS: 146 10*3/uL — AB (ref 150–400)
RBC: 3.29 MIL/uL — ABNORMAL LOW (ref 3.87–5.11)
RDW: 12.2 % (ref 11.5–15.5)
WBC: 12.2 10*3/uL — ABNORMAL HIGH (ref 4.0–10.5)

## 2014-04-04 NOTE — Discharge Summary (Signed)
Discharge paperwork given to patient. Reviewed follow up appointment. Patient given pain medication prescription. Reviewed when to call the doctor with patient and post op instructions. Patient given community resources contact information. Patient is ready for discharge.

## 2014-04-04 NOTE — Discharge Summary (Signed)
I have seen and examined the patient and agree with the assessment and plans.  Calyn Rubi A. Kingstin Heims  MD, FACS  

## 2014-04-04 NOTE — Progress Notes (Signed)
Utilization Review Completed.Kathy Molden T Dowell6/12/2013  

## 2014-04-04 NOTE — Discharge Summary (Signed)
Patient ID: Kathy Roberts MRN: [id number] DOB/AGE: 11-15-1990 22 y.o.  Admit date: 04/03/2014 Discharge date: 04/04/2014  Procedures: Laparoscopic appendectomy, 6/1, Dr. Barrie Dunker  Consults: None  Reason for Admission: Acute abdominal pain, appendicitis on CT. 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 Started having some abdominal pain after lunch yesterday. Mid epigastric, she didn't think it was to bad. She took some Mylanta, without improvement. She had supper and then developed worsening abdominal pain, nausea and vomiting. She came to the ED last PM where work up shows elevated WBC 14.3. CT scan shows 14 mm appendix with fluid and feculent material consistent with appendicitis. 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 We are ask to see."  Admission Diagnoses:  Acute appendicitis  Hospital Course: Pt was admitted from the ED with HPI as above and underwent laparoscopic appendectomy on 6/1 without immediate post-op complication. Her pain was controlled with PO pain medications by afternoon 6/1. As of 6/2 AM she was passing flatus and her pain was much improved. She tolerated a regular PO diet post-op and otherwise was stable for discharge.  Discharge Physical Exam: Temp:  [98.2 F (36.8 C)-98.7 F (37.1 C)] 98.2 F (36.8 C) (06/02 0605) Pulse Rate:  [61-96] 61 (06/02 0605) Resp:  [15-20] 20 (06/02 0605) BP: (87-113)/(48-67) 92/52 mmHg (06/02 0642) SpO2:  [98 %-100 %] 99 % (06/02 0605) Weight:  [117 lb (53.071 kg)] 117 lb (53.071 kg) (06/01 1200) Gen: well-appearing young adult female in NAD Abd: soft, appropriately tender, lap wounds non-erythematous without drainage, with clean / dry / intact dressings, BS+ Cardio: RRR, no murmur appreciated Pulm: CTAB, no wheeze Ext: warm, well-perfused, no LE edema  Discharge Diagnoses:  Active Problems:   Acute appendicitis s/p lap appendectomy 6/1  Discharge  Medications:   Medication List         oxyCODONE-acetaminophen 5-325 MG per tablet  Commonly known as:  PERCOCET/ROXICET  Take 1-2 tablets by mouth every 4 (four) hours as needed for moderate pain.        Discharge Instructions:     Follow-up Information   Follow up with [facility] On 04/25/2014. (2:00pm, arrive by 1:30pm for paperwork)    Contact information:   [address]   [place] [address] [phone]      Signed: Bobbye Morton, MD [facility] (PGY-2, [facility]) 04/04/2014, 7:37 AM

## 2014-04-25 ENCOUNTER — Encounter (INDEPENDENT_AMBULATORY_CARE_PROVIDER_SITE_OTHER): Payer: Self-pay | Admitting: General Surgery

## 2014-04-25 ENCOUNTER — Ambulatory Visit (INDEPENDENT_AMBULATORY_CARE_PROVIDER_SITE_OTHER): Payer: 59 | Admitting: General Surgery

## 2014-04-25 VITALS — BP 116/80 | HR 75 | Temp 98.2°F | Ht 67.0 in | Wt 114.0 lb

## 2014-04-25 DIAGNOSIS — K3580 Unspecified acute appendicitis: Secondary | ICD-10-CM

## 2014-04-25 NOTE — Patient Instructions (Signed)
Follow up as needed

## 2014-04-25 NOTE — Progress Notes (Signed)
Kathy Roberts 09/15/1991 [id number] 04/25/2014   History of Present Illness: Kathy Roberts is a  23 y.o. female who presents today status post lap appy by Dr. Abigail Miyamotoouglas Blackman.  Pathology reveals acute appendicitis.  The patient is tolerating a regular diet, having normal bowel movements, has good pain control.  She  is back to most normal activities.   Physical Exam: Abd: soft, nontender, active bowel sounds, nondistended.  All incisions are well healed.  Impression: 1.  Acute appendicitis, s/p lap appy  Plan: She  is able to return to normal activities. She  may follow up on a prn basis.

## 2015-09-03 IMAGING — CT CT ABD-PELV W/ CM
2 of 5 series · 15 of 46 positions shown, 17 images · IV contrast (APPLIED)
Comparison: None.

CLINICAL DATA: Epigastric and right lower quadrant pain.

EXAM:
CT ABDOMEN AND PELVIS WITH CONTRAST
TECHNIQUE: Multidetector CT imaging of the abdomen and pelvis was performed
using the standard protocol following bolus administration of
intravenous contrast.
CONTRAST:  100mL OMNIPAQUE IOHEXOL 300 MG/ML  SOLN

[Series 2: abd/ pelvis 5.0 i30f 1 · axial · 0.62mm/px · z∈[+753,+1138]mm · 12 of 87 slices shown, 14 images]
[im 5/87  soft-tissue]
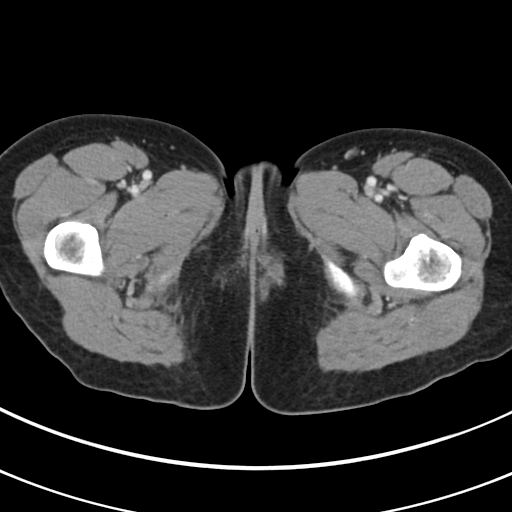
[im 5/87  bone]
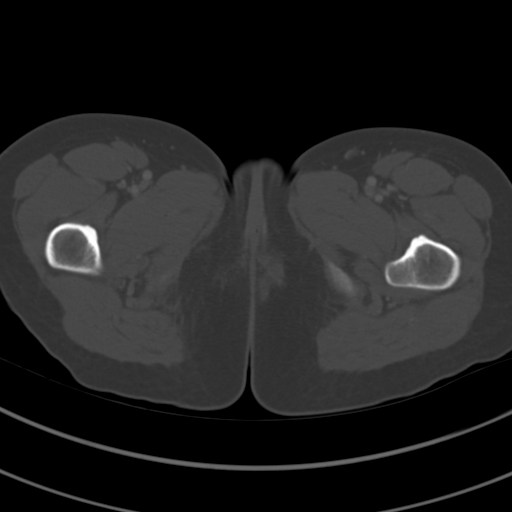
[im 13/87  soft-tissue]
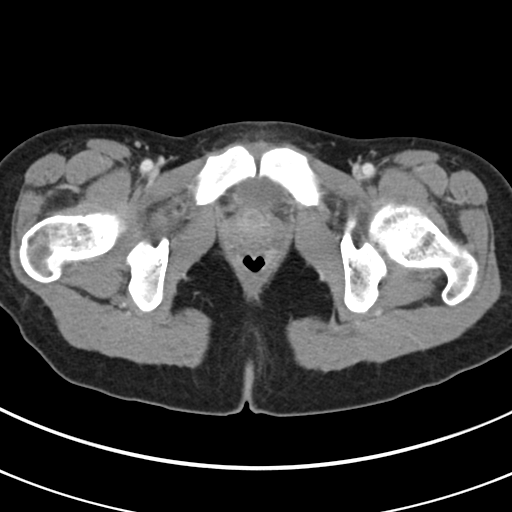
[im 21/87  soft-tissue]
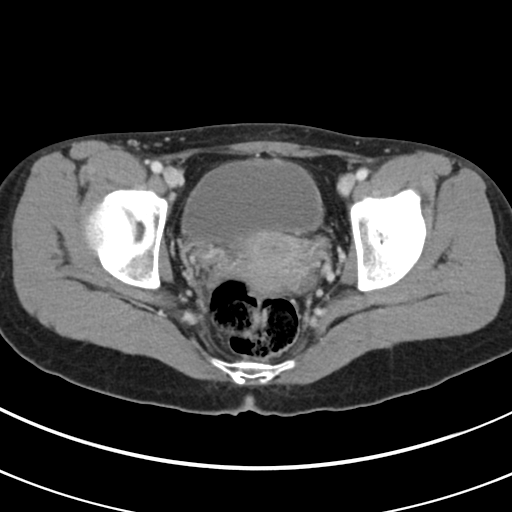
[im 25/87  soft-tissue]
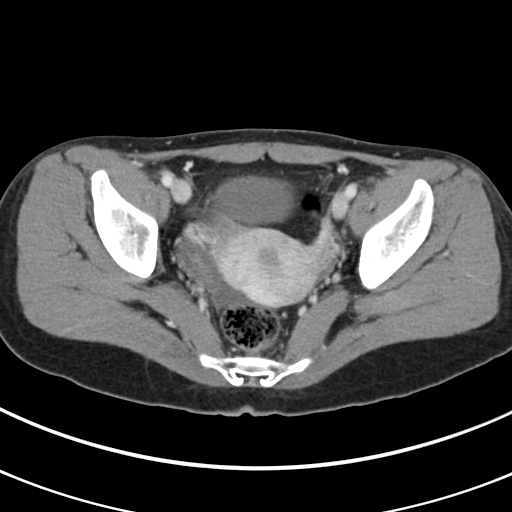
[im 33/87  soft-tissue]
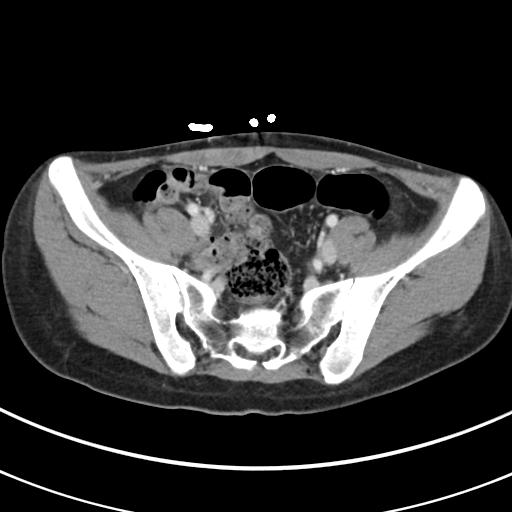
[im 41/87  soft-tissue]
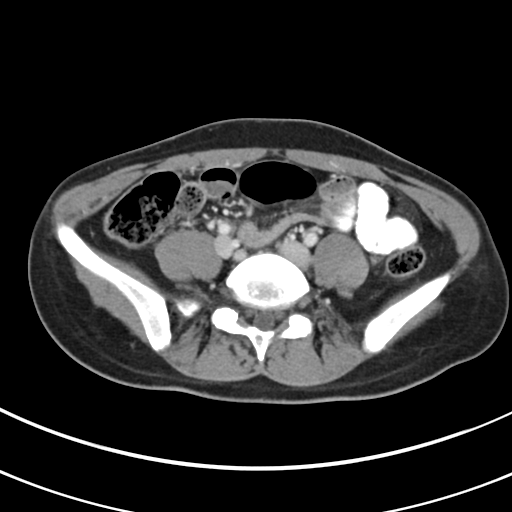
[im 46/87  soft-tissue]
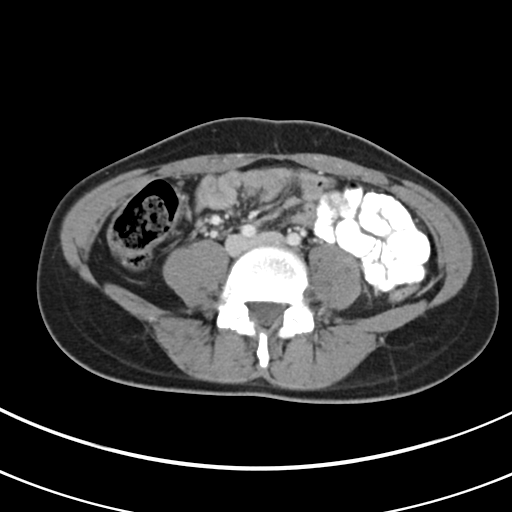
[im 54/87  soft-tissue]
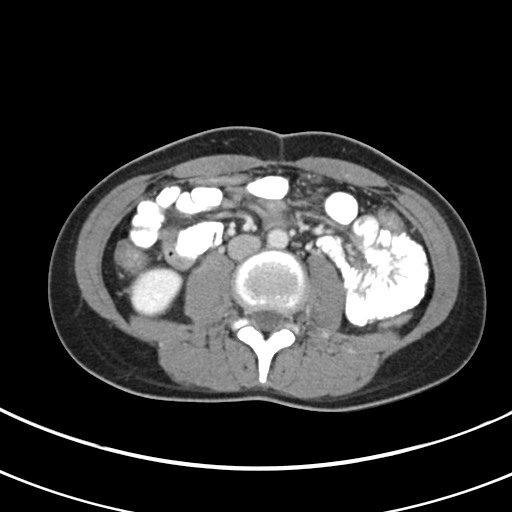
[im 62/87  soft-tissue]
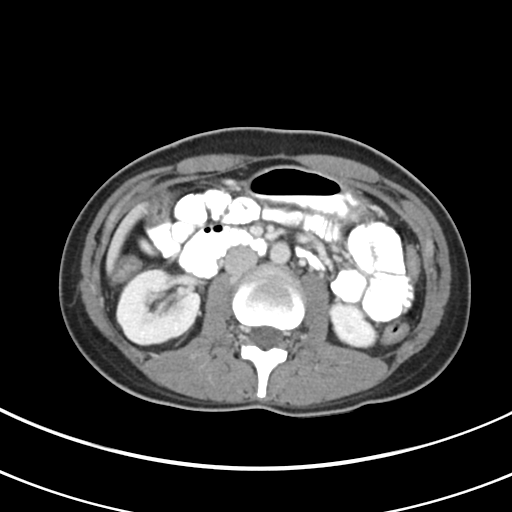
[im 62/87  bone]
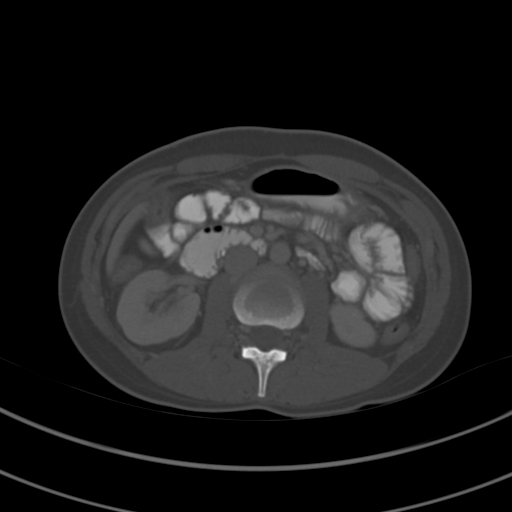
[im 66/87  soft-tissue]
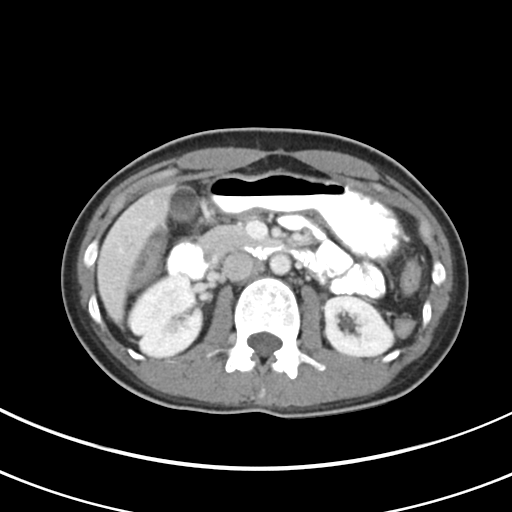
[im 74/87  soft-tissue]
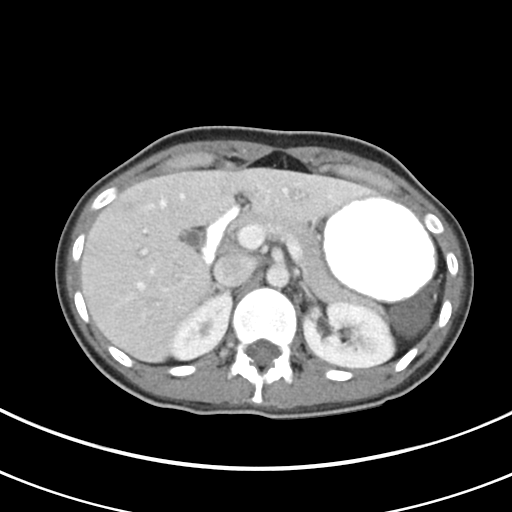
[im 82/87  soft-tissue]
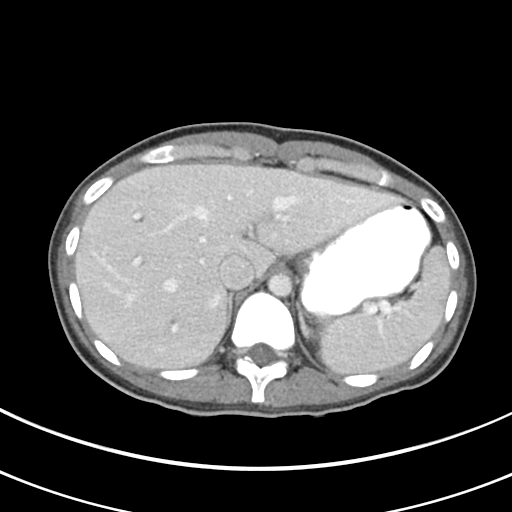

[Series 4: coronal soft tissue · coronal · 0.60mm/px · 3 of 62 slices shown]
[im 21/62  soft-tissue]
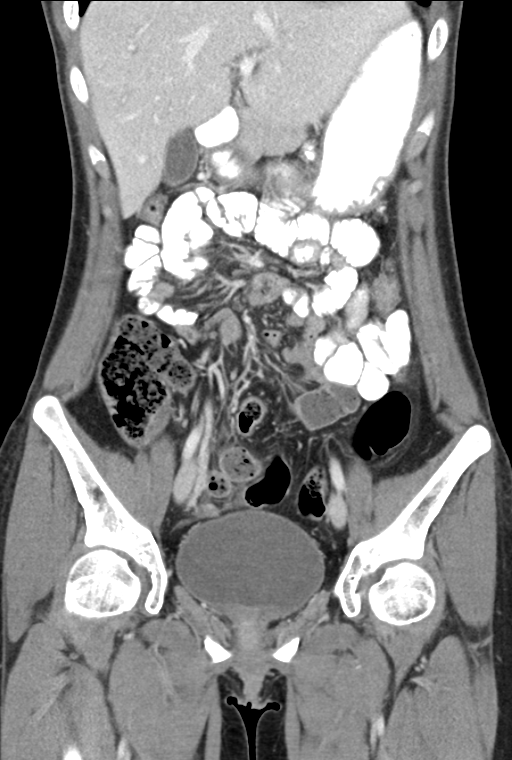
[im 28/62  soft-tissue]
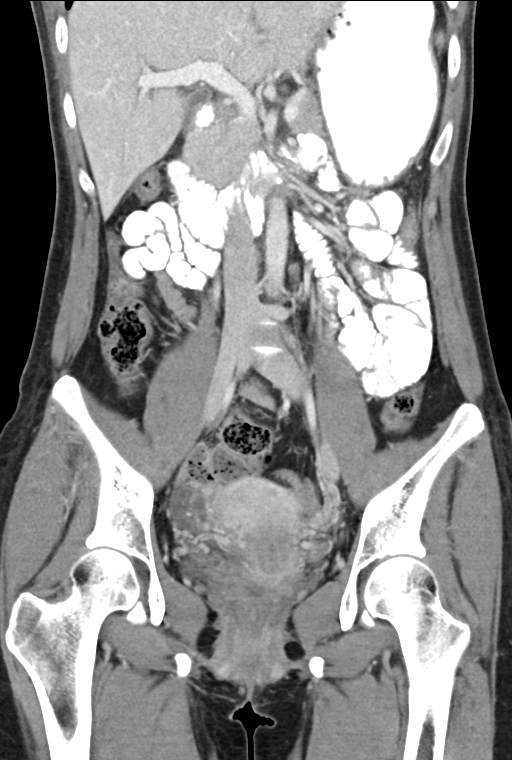
[im 34/62  soft-tissue]
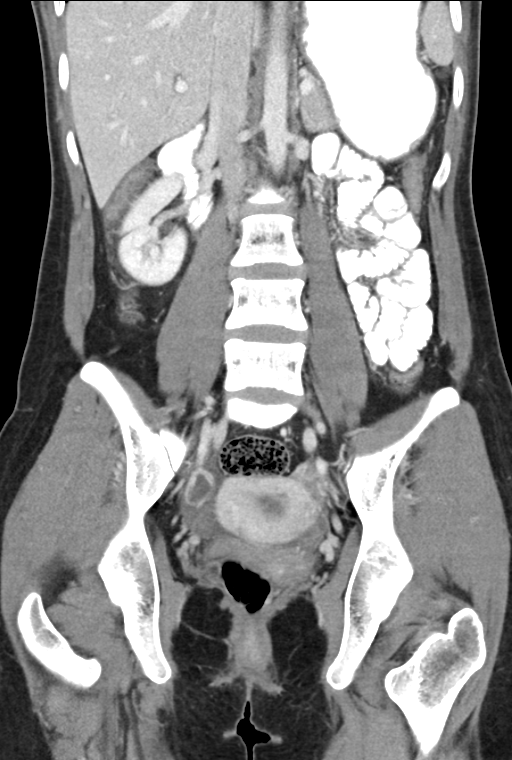

[15 of 46 positions shown; findings below may reference images not displayed]

FINDINGS: BODY WALL: Unremarkable.

LOWER CHEST: Unremarkable.

ABDOMEN/PELVIS:

Liver: No focal abnormality.

Biliary: No evidence of biliary obstruction or stone.

Pancreas: Unremarkable.

Spleen: Unremarkable.

Adrenals: Unremarkable.

Kidneys and ureters: No hydronephrosis or stone.

Bladder: Unremarkable.

Reproductive: Corpus luteum noted on the right

Bowel: The appendix is dilated to 14 mm outer wall diameter and
contains fluid and feculent material. The wall is avidly enhancing
and there is early periappendiceal fat haziness. No bowel
obstruction. No evidence of perforation or abscess.

Retroperitoneum: There is a lobulated water density mass in the left
upper quadrant, in the region of the splenorenal ligament, measuring
up to 4 cm. This is most consistent with a lymphatic malformation.
No solid components identified.

Peritoneum: No free fluid or gas.

Vascular: No acute abnormality.

OSSEOUS: No acute abnormalities.

These results were called by telephone at the time of interpretation
on 04/03/2014 at [DATE] to Dr. MACHO TIGER , who verbally acknowledged
these results.
IMPRESSION: 1. Acute, non perforated appendicitis.
2. 4 cm left upper quadrant mass consistent with lymphatic
malformation.
